# Patient Record
Sex: Male | Born: 1985 | Race: Black or African American | Hispanic: No | Marital: Single | Smoking: Current every day smoker
Health system: Southern US, Community
[De-identification: ages and names within clinical notes are randomized; demographics above are authoritative.]

## PROBLEM LIST (undated history)

## (undated) DIAGNOSIS — K259 Gastric ulcer, unspecified as acute or chronic, without hemorrhage or perforation: Secondary | ICD-10-CM

## (undated) DIAGNOSIS — K219 Gastro-esophageal reflux disease without esophagitis: Secondary | ICD-10-CM

---
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
## 2006-04-06 ENCOUNTER — Emergency Department (HOSPITAL_COMMUNITY): Admission: EM | Admit: 2006-04-06 | Discharge: 2006-04-06 | Payer: Self-pay | Admitting: Emergency Medicine

## 2013-10-16 ENCOUNTER — Emergency Department (HOSPITAL_COMMUNITY): Payer: 59

## 2013-10-16 ENCOUNTER — Encounter (HOSPITAL_COMMUNITY): Payer: Self-pay | Admitting: Emergency Medicine

## 2013-10-16 ENCOUNTER — Emergency Department (HOSPITAL_COMMUNITY)
Admission: EM | Admit: 2013-10-16 | Discharge: 2013-10-16 | Disposition: A | Discharge status: Home / Self Care | Payer: 59 | Attending: Emergency Medicine | Admitting: Emergency Medicine

## 2013-10-16 DIAGNOSIS — R109 Unspecified abdominal pain: Secondary | ICD-10-CM | POA: Insufficient documentation

## 2013-10-16 DIAGNOSIS — Z7982 Long term (current) use of aspirin: Secondary | ICD-10-CM | POA: Insufficient documentation

## 2013-10-16 DIAGNOSIS — F17200 Nicotine dependence, unspecified, uncomplicated: Secondary | ICD-10-CM | POA: Insufficient documentation

## 2013-10-16 LAB — URINALYSIS, ROUTINE W REFLEX MICROSCOPIC
BILIRUBIN URINE: NEGATIVE
Glucose, UA: NEGATIVE mg/dL
HGB URINE DIPSTICK: NEGATIVE
KETONES UR: 40 mg/dL — AB
NITRITE: NEGATIVE
Protein, ur: 30 mg/dL — AB
SPECIFIC GRAVITY, URINE: 1.025 (ref 1.005–1.030)
Urobilinogen, UA: 1 mg/dL (ref 0.0–1.0)
pH: 8 (ref 5.0–8.0)

## 2013-10-16 LAB — CBC WITH DIFFERENTIAL/PLATELET
BASOS ABS: 0 10*3/uL (ref 0.0–0.1)
BASOS PCT: 0 % (ref 0–1)
EOS ABS: 0 10*3/uL (ref 0.0–0.7)
Eosinophils Relative: 0 % (ref 0–5)
HEMATOCRIT: 42.8 % (ref 39.0–52.0)
Hemoglobin: 14.8 g/dL (ref 13.0–17.0)
LYMPHS PCT: 11 % — AB (ref 12–46)
Lymphs Abs: 1.1 10*3/uL (ref 0.7–4.0)
MCH: 31.4 pg (ref 26.0–34.0)
MCHC: 34.6 g/dL (ref 30.0–36.0)
MCV: 90.7 fL (ref 78.0–100.0)
MONOS PCT: 5 % (ref 3–12)
Monocytes Absolute: 0.5 10*3/uL (ref 0.1–1.0)
NEUTROS ABS: 8 10*3/uL — AB (ref 1.7–7.7)
NEUTROS PCT: 84 % — AB (ref 43–77)
Platelets: 175 10*3/uL (ref 150–400)
RBC: 4.72 MIL/uL (ref 4.22–5.81)
RDW: 12.4 % (ref 11.5–15.5)
WBC: 9.5 10*3/uL (ref 4.0–10.5)

## 2013-10-16 LAB — COMPREHENSIVE METABOLIC PANEL
ALBUMIN: 4.6 g/dL (ref 3.5–5.2)
ALK PHOS: 43 U/L (ref 39–117)
ALT: 16 U/L (ref 0–53)
AST: 20 U/L (ref 0–37)
Anion gap: 14 (ref 5–15)
BILIRUBIN TOTAL: 0.7 mg/dL (ref 0.3–1.2)
BUN: 10 mg/dL (ref 6–23)
CO2: 25 mEq/L (ref 19–32)
Calcium: 9.7 mg/dL (ref 8.4–10.5)
Chloride: 98 mEq/L (ref 96–112)
Creatinine, Ser: 1 mg/dL (ref 0.50–1.35)
GFR calc Af Amer: >90 mL/min (ref >90)
GLUCOSE: 94 mg/dL (ref 70–99)
POTASSIUM: 3.2 meq/L — AB (ref 3.7–5.3)
SODIUM: 137 meq/L (ref 137–147)
TOTAL PROTEIN: 7.2 g/dL (ref 6.0–8.3)

## 2013-10-16 LAB — URINE MICROSCOPIC-ADD ON

## 2013-10-16 LAB — LIPASE, BLOOD: Lipase: 18 U/L (ref 11–59)

## 2013-10-16 MED ORDER — FAMOTIDINE 20 MG PO TABS
20.0000 mg | ORAL_TABLET | Freq: Once | ORAL | Status: AC
  Administered 2013-10-16: 20 mg via ORAL
  Filled 2013-10-16: qty 1

## 2013-10-16 MED ORDER — PANTOPRAZOLE SODIUM 40 MG PO TBEC: 40.0000 mg | DELAYED_RELEASE_TABLET | Freq: Every day | ORAL | Status: DC

## 2013-10-16 MED ORDER — GI COCKTAIL ~~LOC~~
30.0000 mL | Freq: Once | ORAL | Status: AC
  Administered 2013-10-16: 30 mL via ORAL
  Filled 2013-10-16: qty 30

## 2013-10-16 MED ORDER — TRAMADOL HCL 50 MG PO TABS: 50.0000 mg | ORAL_TABLET | Freq: Four times a day (QID) | ORAL | Status: DC | PRN

## 2013-10-16 MED ORDER — POTASSIUM CHLORIDE CRYS ER 20 MEQ PO TBCR
40.0000 meq | EXTENDED_RELEASE_TABLET | Freq: Once | ORAL | Status: AC
  Administered 2013-10-16: 40 meq via ORAL
  Filled 2013-10-16: qty 2

## 2013-10-16 NOTE — Discharge Instructions (Signed)
Rest. Drink plenty of fluids. Take protonix (acid blocker medication). You may also try pepcid and maalox as need for symptom relief. You may take ultram as need for pain - no driving when taking. Follow up with primary care doctor/GI doctor, in the next 1-2 weeks if symptoms fail to improve/resolve. Return to ER right away if worse, severe abdominal pain, persistent vomiting, fevers, other concern.     Abdominal Pain Many things can cause abdominal pain. Usually, abdominal pain is not caused by a disease and will improve without treatment. It can often be observed and treated at home. Your health care provider will do a physical exam and possibly order blood tests and X-rays to help determine the seriousness of your pain. However, in many cases, more time must pass before a clear cause of the pain can be found. Before that point, your health care provider may not know if you need more testing or further treatment. HOME CARE INSTRUCTIONS  Monitor your abdominal pain for any changes. The following actions may help to alleviate any discomfort you are experiencing:  Only take over-the-counter or prescription medicines as directed by your health care provider.  Do not take laxatives unless directed to do so by your health care provider.  Try a clear liquid diet (broth, tea, or water) as directed by your health care provider. Slowly move to a bland diet as tolerated. SEEK MEDICAL CARE IF:  You have unexplained abdominal pain.  You have abdominal pain associated with nausea or diarrhea.  You have pain when you urinate or have a bowel movement.  You experience abdominal pain that wakes you in the night.  You have abdominal pain that is worsened or improved by eating food.  You have abdominal pain that is worsened with eating fatty foods.  You have a fever. SEEK IMMEDIATE MEDICAL CARE IF:   Your pain does not go away within 2 hours.  You keep throwing up (vomiting).  Your pain is felt  only in portions of the abdomen, such as the right side or the left lower portion of the abdomen.  You pass bloody or black tarry stools. MAKE SURE YOU:  Understand these instructions.   Will watch your condition.   Will get help right away if you are not doing well or get worse.  Document Released: 01/11/2005 Document Revised: 04/08/2013 Document Reviewed: 12/11/2012 [facility] Patient Information 2015 [place], [place]. This information is not intended to replace advice given to you by your health care provider. Make sure you discuss any questions you have with your health care provider.     Gastritis, Adult Gastritis is soreness and swelling (inflammation) of the lining of the stomach. Gastritis can develop as a sudden onset (acute) or long-term (chronic) condition. If gastritis is not treated, it can lead to stomach bleeding and ulcers. CAUSES  Gastritis occurs when the stomach lining is weak or damaged. Digestive juices from the stomach then inflame the weakened stomach lining. The stomach lining may be weak or damaged due to viral or bacterial infections. One common bacterial infection is the Helicobacter pylori infection. Gastritis can also result from excessive alcohol consumption, taking certain medicines, or having too much acid in the stomach.  SYMPTOMS  In some cases, there are no symptoms. When symptoms are present, they may include:  Pain or a burning sensation in the upper abdomen.  Nausea.  Vomiting.  An uncomfortable feeling of fullness after eating. DIAGNOSIS  Your caregiver may suspect you have gastritis based on your  symptoms and a physical exam. To determine the cause of your gastritis, your caregiver may perform the following:  Blood or stool tests to check for the H pylori bacterium.  Gastroscopy. A thin, flexible tube (endoscope) is passed down the esophagus and into the stomach. The endoscope has a light and camera on the end. Your caregiver uses the  endoscope to view the inside of the stomach.  Taking a tissue sample (biopsy) from the stomach to examine under a microscope. TREATMENT  Depending on the cause of your gastritis, medicines may be prescribed. If you have a bacterial infection, such as an H pylori infection, antibiotics may be given. If your gastritis is caused by too much acid in the stomach, H2 blockers or antacids may be given. Your caregiver may recommend that you stop taking aspirin, ibuprofen, or other nonsteroidal anti-inflammatory drugs (NSAIDs). HOME CARE INSTRUCTIONS  Only take over-the-counter or prescription medicines as directed by your caregiver.  If you were given antibiotic medicines, take them as directed. Finish them even if you start to feel better.  Drink enough fluids to keep your urine clear or pale yellow.  Avoid foods and drinks that make your symptoms worse, such as:  Caffeine or alcoholic drinks.  Chocolate.  Peppermint or mint flavorings.  Garlic and onions.  Spicy foods.  Citrus fruits, such as oranges, lemons, or limes.  Tomato-based foods such as sauce, chili, salsa, and pizza.  Fried and fatty foods.  Eat small, frequent meals instead of large meals. SEEK IMMEDIATE MEDICAL CARE IF:   You have black or dark red stools.  You vomit blood or material that looks like coffee grounds.  You are unable to keep fluids down.  Your abdominal pain gets worse.  You have a fever.  You do not feel better after 1 week.  You have any other questions or concerns. MAKE SURE YOU:  Understand these instructions.  Will watch your condition.  Will get help right away if you are not doing well or get worse. Document Released: 03/28/2001 Document Revised: 10/03/2011 Document Reviewed: 05/17/2011 [facility]Exit[facility] Patient Information 2015 [place]ExitCare, [place]LL[place]. This information is not intended to replace advice given to you by your health care provider. Make sure you discuss any questions you have with your  health care provider.

## 2013-10-16 NOTE — ED Provider Notes (Addendum)
CSN: [id number]     Arrival date & time 10/16/13  [id number] History   First MD Initiated Contact with Patient 10/16/13 1007     Chief Complaint  Patient presents with  . Abdominal Pain     (Consider location/radiation/quality/duration/timing/severity/associated sxs/prior Treatment) Patient is a 28 y.o. male presenting with abdominal pain. The history is provided by the patient and a parent.  Abdominal Pain Associated symptoms: no chest pain, no cough, no diarrhea, no fever, no shortness of breath, no sore throat and no vomiting   pt c/o mid to upper abdominal pain in the past several months. No acute or abrupt change today. States at times is worse especially if/when he tries to eat a lot in the morning. Pain constant, dull, non radiating. No back/flank pain. No nv. No fever or chills. Having normal bms. No dysuria, hematuria or gu c/o. No recent wt loss or wt gain. No hx pud, pancreatitis, or gallstones. No prior abd surgery.  Parent adds she thinks it may be related to stress.    History reviewed. No pertinent past medical history. History reviewed. No pertinent past surgical history. No family history on file. History  Substance Use Topics  . Smoking status: Current Every Day Smoker    Types: Cigarettes  . Smokeless tobacco: Not on file  . Alcohol Use: No    Review of Systems  Constitutional: Negative for fever.  HENT: Negative for sore throat.   Eyes: Negative for redness.  Respiratory: Negative for cough and shortness of breath.   Cardiovascular: Negative for chest pain.  Gastrointestinal: Positive for abdominal pain. Negative for vomiting, diarrhea and blood in stool.  Genitourinary: Negative for flank pain.  Musculoskeletal: Negative for back pain and neck pain.  Skin: Negative for rash.  Neurological: Negative for headaches.  Hematological: Does not bruise/bleed easily.  Psychiatric/Behavioral: Negative for confusion.      Allergies  Review of patient's allergies  indicates no known allergies.  Home Medications   Prior to Admission medications   Medication Sig Start Date End Date Taking? Authorizing Provider  aspirin 325 MG tablet Take 325 mg by mouth daily.   Yes Historical Provider, MD   BP 108/64  Pulse 63  Temp(Src) 98 F (36.7 C) (Oral)  Resp 17  SpO2 99% Physical Exam  Nursing note and vitals reviewed. Constitutional: He is oriented to person, place, and time. He appears well-developed and well-nourished. No distress.  HENT:  Mouth/Throat: Oropharynx is clear and moist.  Eyes: Conjunctivae are normal. No scleral icterus.  Neck: Neck supple. No tracheal deviation present.  Cardiovascular: Normal rate, regular rhythm, normal heart sounds and intact distal pulses.   Pulmonary/Chest: Effort normal and breath sounds normal. No accessory muscle usage. No respiratory distress.  Abdominal: Soft. Bowel sounds are normal. He exhibits no distension and no mass. There is no tenderness. There is no rebound and no guarding.  No hernia  Genitourinary:  Normal ext genitalia. No cva tenderness.   Musculoskeletal: Normal range of motion.  Neurological: He is alert and oriented to person, place, and time.  Skin: Skin is warm and dry.  Psychiatric: He has a normal mood and affect.    ED Course  Procedures (including critical care time) Labs Review  Results for orders placed during the hospital encounter of 10/16/13  CBC WITH DIFFERENTIAL      Result Value Ref Range   WBC 9.5  4.0 - 10.5 K/uL   RBC 4.72  4.22 - 5.81 MIL/uL   Hemoglobin 14.8  13.0 - 17.0 g/dL   HCT 42.8  29.539.0 - 28.452.0 %   MCV 90.7  69.678.0 - 100.0 fL   MCH 31.4  26.0 - 34.0 pg   MCHC 34.6  30.0 - 36.0 g/dL   RDW 13.212.4  44.011.5 - 10.215.5 %   Platelets 175  150 - 400 K/uL   Neutrophils Relative % 84 (*) 43 - 77 %   Neutro Abs 8.0 (*) 1.7 - 7.7 K/uL   Lymphocytes Relative 11 (*) 12 - 46 %   Lymphs Abs 1.1  0.7 - 4.0 K/uL   Monocytes Relative 5  3 - 12 %   Monocytes Absolute 0.5  0.1 -  1.0 K/uL   Eosinophils Relative 0  0 - 5 %   Eosinophils Absolute 0.0  0.0 - 0.7 K/uL   Basophils Relative 0  0 - 1 %   Basophils Absolute 0.0  0.0 - 0.1 K/uL  COMPREHENSIVE METABOLIC PANEL      Result Value Ref Range   Sodium 137  137 - 147 mEq/L   Potassium 3.2 (*) 3.7 - 5.3 mEq/L   Chloride 98  96 - 112 mEq/L   CO2 25  19 - 32 mEq/L   Glucose, Bld 94  70 - 99 mg/dL   BUN 10  6 - 23 mg/dL   Creatinine, Ser 7.251.00  0.50 - 1.35 mg/dL   Calcium 9.7  8.4 - 36.610.5 mg/dL   Total Protein 7.2  6.0 - 8.3 g/dL   Albumin 4.6  3.5 - 5.2 g/dL   AST 20  0 - 37 U/L   ALT 16  0 - 53 U/L   Alkaline Phosphatase 43  39 - 117 U/L   Total Bilirubin 0.7  0.3 - 1.2 mg/dL   GFR calc non Af Amer >90  >90 mL/min   GFR calc Af Amer >90  >90 mL/min   Anion gap 14  5 - 15  LIPASE, BLOOD      Result Value Ref Range   Lipase 18  11 - 59 U/L  URINALYSIS, ROUTINE W REFLEX MICROSCOPIC      Result Value Ref Range   Color, Urine AMBER (*) YELLOW   APPearance CLEAR  CLEAR   Specific Gravity, Urine 1.025  1.005 - 1.030   pH 8.0  5.0 - 8.0   Glucose, UA NEGATIVE  NEGATIVE mg/dL   Hgb urine dipstick NEGATIVE  NEGATIVE   Bilirubin Urine NEGATIVE  NEGATIVE   Ketones, ur 40 (*) NEGATIVE mg/dL   Protein, ur 30 (*) NEGATIVE mg/dL   Urobilinogen, UA 1.0  0.0 - 1.0 mg/dL   Nitrite NEGATIVE  NEGATIVE   Leukocytes, UA SMALL (*) NEGATIVE  URINE MICROSCOPIC-ADD ON      Result Value Ref Range   WBC, UA 3-6  <3 WBC/hpf   Urine-Other MUCOUS PRESENT     Dg Abd 1 View  10/16/2013   CLINICAL DATA:  Lower abdominal pain, vomiting  EXAM: ABDOMEN - 1 VIEW  COMPARISON:  None.  FINDINGS: There is nonobstructive bowel gas pattern some colonic gas noted without colonic distension.  IMPRESSION: Nonobstructive bowel gas pattern. Some colonic gas noted without significant colonic distention.   Electronically Signed   By: Natasha MeadLiviu  Pop M.D.   On: 10/16/2013 11:03     MDM  Labs.  Reviewed nursing notes and prior charts for additional  history.   pepcid and gi cocktail po.  Relief w gi meds.  k low, kcl po.  Discussed  results w pt.  On recheck abd soft nt.  No nv. Afeb.  Discussed diff dx including possible gastritis.   Given prolonged nature pain, will also provide referral to gi, in addition to acid blocker rx.  Pt appears stable for d/c.  At d/c, pt requests pain med for home as well.     Suzi Roots, MD 10/16/13 [phone]

## 2013-10-16 NOTE — ED Notes (Signed)
Pt transported to xray 

## 2013-10-16 NOTE — ED Notes (Signed)
Pt c/o abd pain that has been going on for past several months. Pt states that the pain this morning got so bad that's what brought him in to be seen.  Pt state he has occasional n/v/d.

## 2013-10-16 NOTE — ED Notes (Addendum)
Pt reports intermittent abdominal pain for months but worsening this am. Pt reports one episode of vomiting yesterday. Pt denies nausea at present time. Pt reports small loose stool today.

## 2013-10-16 NOTE — ED Notes (Addendum)
Pt in restroom providing urine sample at present time will assess and administer pain medication with pt return.

## 2013-10-20 ENCOUNTER — Other Ambulatory Visit: Payer: Self-pay | Admitting: Gastroenterology

## 2013-10-20 DIAGNOSIS — R109 Unspecified abdominal pain: Secondary | ICD-10-CM

## 2013-10-22 ENCOUNTER — Encounter (HOSPITAL_COMMUNITY): Payer: Self-pay | Admitting: Emergency Medicine

## 2013-10-22 ENCOUNTER — Emergency Department (HOSPITAL_COMMUNITY)
Admission: EM | Admit: 2013-10-22 | Discharge: 2013-10-22 | Disposition: A | Discharge status: Home / Self Care | Payer: 59 | Attending: Emergency Medicine | Admitting: Emergency Medicine

## 2013-10-22 ENCOUNTER — Emergency Department (HOSPITAL_COMMUNITY): Payer: 59

## 2013-10-22 DIAGNOSIS — Z79899 Other long term (current) drug therapy: Secondary | ICD-10-CM | POA: Insufficient documentation

## 2013-10-22 DIAGNOSIS — Z792 Long term (current) use of antibiotics: Secondary | ICD-10-CM | POA: Insufficient documentation

## 2013-10-22 DIAGNOSIS — R1110 Vomiting, unspecified: Secondary | ICD-10-CM | POA: Insufficient documentation

## 2013-10-22 DIAGNOSIS — R1013 Epigastric pain: Secondary | ICD-10-CM | POA: Insufficient documentation

## 2013-10-22 DIAGNOSIS — F17200 Nicotine dependence, unspecified, uncomplicated: Secondary | ICD-10-CM | POA: Insufficient documentation

## 2013-10-22 DIAGNOSIS — R109 Unspecified abdominal pain: Secondary | ICD-10-CM

## 2013-10-22 LAB — COMPREHENSIVE METABOLIC PANEL
ALBUMIN: 4.3 g/dL (ref 3.5–5.2)
ALT: 16 U/L (ref 0–53)
ANION GAP: 16 — AB (ref 5–15)
AST: 23 U/L (ref 0–37)
Alkaline Phosphatase: 33 U/L — ABNORMAL LOW (ref 39–117)
BILIRUBIN TOTAL: 0.6 mg/dL (ref 0.3–1.2)
BUN: 7 mg/dL (ref 6–23)
CALCIUM: 9.1 mg/dL (ref 8.4–10.5)
CO2: 23 mEq/L (ref 19–32)
CREATININE: 1.17 mg/dL (ref 0.50–1.35)
Chloride: 99 mEq/L (ref 96–112)
GFR calc Af Amer: >90 mL/min (ref >90)
GFR calc non Af Amer: 84 mL/min — ABNORMAL LOW (ref >90)
GLUCOSE: 101 mg/dL — AB (ref 70–99)
Potassium: 3.8 mEq/L (ref 3.7–5.3)
SODIUM: 138 meq/L (ref 137–147)
Total Protein: 6.9 g/dL (ref 6.0–8.3)

## 2013-10-22 LAB — URINALYSIS, ROUTINE W REFLEX MICROSCOPIC
Bilirubin Urine: NEGATIVE
GLUCOSE, UA: NEGATIVE mg/dL
Hgb urine dipstick: NEGATIVE
KETONES UR: 15 mg/dL — AB
Leukocytes, UA: NEGATIVE
NITRITE: NEGATIVE
PH: 8.5 — AB (ref 5.0–8.0)
PROTEIN: NEGATIVE mg/dL
Specific Gravity, Urine: 1.024 (ref 1.005–1.030)
UROBILINOGEN UA: 1 mg/dL (ref 0.0–1.0)

## 2013-10-22 LAB — CBC
HCT: 42.4 % (ref 39.0–52.0)
Hemoglobin: 14.5 g/dL (ref 13.0–17.0)
MCH: 31.2 pg (ref 26.0–34.0)
MCHC: 34.2 g/dL (ref 30.0–36.0)
MCV: 91.2 fL (ref 78.0–100.0)
Platelets: 196 10*3/uL (ref 150–400)
RBC: 4.65 MIL/uL (ref 4.22–5.81)
RDW: 12.6 % (ref 11.5–15.5)
WBC: 5.2 10*3/uL (ref 4.0–10.5)

## 2013-10-22 LAB — LIPASE, BLOOD: Lipase: 14 U/L (ref 11–59)

## 2013-10-22 MED ORDER — IOHEXOL 300 MG/ML  SOLN
25.0000 mL | INTRAMUSCULAR | Status: AC
  Administered 2013-10-22: 25 mL via ORAL

## 2013-10-22 MED ORDER — ONDANSETRON 8 MG PO TBDP: 8.0000 mg | ORAL_TABLET | Freq: Three times a day (TID) | ORAL | Status: DC | PRN

## 2013-10-22 MED ORDER — PANTOPRAZOLE SODIUM 40 MG IV SOLR
40.0000 mg | Freq: Once | INTRAVENOUS | Status: AC
  Administered 2013-10-22: 40 mg via INTRAVENOUS
  Filled 2013-10-22: qty 40

## 2013-10-22 MED ORDER — ONDANSETRON HCL 4 MG/2ML IJ SOLN
4.0000 mg | Freq: Once | INTRAMUSCULAR | Status: AC
  Administered 2013-10-22: 4 mg via INTRAVENOUS
  Filled 2013-10-22: qty 2

## 2013-10-22 MED ORDER — SODIUM CHLORIDE 0.9 % IV BOLUS (SEPSIS)
1000.0000 mL | Freq: Once | INTRAVENOUS | Status: AC
  Administered 2013-10-22: 1000 mL via INTRAVENOUS

## 2013-10-22 MED ORDER — TRAMADOL HCL 50 MG PO TABS: 50.0000 mg | ORAL_TABLET | Freq: Four times a day (QID) | ORAL | Status: DC | PRN

## 2013-10-22 MED ORDER — HYDROMORPHONE HCL PF 1 MG/ML IJ SOLN
0.5000 mg | Freq: Once | INTRAMUSCULAR | Status: AC
  Administered 2013-10-22: 0.5 mg via INTRAVENOUS
  Filled 2013-10-22: qty 1

## 2013-10-22 MED ORDER — IOHEXOL 300 MG/ML  SOLN
80.0000 mL | Freq: Once | INTRAMUSCULAR | Status: AC | PRN
  Administered 2013-10-22: 80 mL via INTRAVENOUS

## 2013-10-22 NOTE — ED Notes (Signed)
Pt resting quietly at the time. Vital signs stable. No signs of distress noted. Denies pain. Mother at bedside.

## 2013-10-22 NOTE — ED Notes (Signed)
[facility] and vomiting, was diagnosed with ulcer and sent to GI dr. Rock NephewPt went to GI appt on Monday and was diagnosed with UTI and started on antibiotics. Pt still having n/v and generalized abd pain.

## 2013-10-22 NOTE — ED Notes (Signed)
Pt denies abdominal pain at the time. Finished drinking oral contrast. Family at bedside. No signs of distress noted.

## 2013-10-22 NOTE — Discharge Instructions (Signed)
Try pepcid, maalox, or gas x as need for symptom relief. You may take zofran as need for nausea. You may take ultram as need for pain - no driving when taking.  Follow up with your gi doctor in the next 1-2 weeks. Return to ER if worse, new symptoms, fevers, persistent vomiting, other concern.  You were given pain medication in the ER - no driving for the next 4 hours.       Abdominal Pain Many things can cause abdominal pain. Usually, abdominal pain is not caused by a disease and will improve without treatment. It can often be observed and treated at home. Your health care provider will do a physical exam and possibly order blood tests and X-rays to help determine the seriousness of your pain. However, in many cases, more time must pass before a clear cause of the pain can be found. Before that point, your health care provider may not know if you need more testing or further treatment. HOME CARE INSTRUCTIONS  Monitor your abdominal pain for any changes. The following actions may help to alleviate any discomfort you are experiencing:  Only take over-the-counter or prescription medicines as directed by your health care provider.  Do not take laxatives unless directed to do so by your health care provider.  Try a clear liquid diet (broth, tea, or water) as directed by your health care provider. Slowly move to a bland diet as tolerated. SEEK MEDICAL CARE IF:  You have unexplained abdominal pain.  You have abdominal pain associated with nausea or diarrhea.  You have pain when you urinate or have a bowel movement.  You experience abdominal pain that wakes you in the night.  You have abdominal pain that is worsened or improved by eating food.  You have abdominal pain that is worsened with eating fatty foods.  You have a fever. SEEK IMMEDIATE MEDICAL CARE IF:   Your pain does not go away within 2 hours.  You keep throwing up (vomiting).  Your pain is felt only in portions of the  abdomen, such as the right side or the left lower portion of the abdomen.  You pass bloody or black tarry stools. MAKE SURE YOU:  Understand these instructions.   Will watch your condition.   Will get help right away if you are not doing well or get worse.  Document Released: 01/11/2005 Document Revised: 04/08/2013 Document Reviewed: 12/11/2012 [facility] Patient Information 2015 [place]Care, [place]. This information is not intended to replace advice given to you by your health care provider. Make sure you discuss any questions you have with your health care provider.

## 2013-10-22 NOTE — ED Notes (Signed)
Pt brought back to room via wheelchair with family in tow; pt getting undressed and into a gown at this time; Chastity, NT present in room

## 2013-10-22 NOTE — ED Provider Notes (Signed)
CSN: [id number]     Arrival date & time 10/22/13  1131 History   First MD Initiated Contact with Patient 10/22/13 1206     Chief Complaint  Patient presents with  . Abdominal Pain  . Emesis     (Consider location/radiation/quality/duration/timing/severity/associated sxs/prior Treatment) Patient is a 28 y.o. male presenting with abdominal pain and vomiting. The history is provided by the patient.  Abdominal Pain Associated symptoms: vomiting   Associated symptoms: no chest pain, no chills, no cough, no dysuria, no fever, no hematuria, no shortness of breath and no sore throat   Emesis Associated symptoms: abdominal pain   Associated symptoms: no chills, no headaches and no sore throat   pt c/o mid to upper abdominal pain in the past several months. No acute or abrupt change today. States at times is worse especially if/when he tries to eat a lot in the morning. Pain constant, dull, non radiating. No back/flank pain. No nv. No fever or chills. Having normal bms. No dysuria, hematuria or gu c/o. No recent wt loss or wt gain. No hx pud, pancreatitis, or gallstones. No prior abd surgery. Parent adds she thinks it may be related to stress.  Was recently in ed for same and referred to Gi.  States saw gi this week, who told pt possible uti, and placed on bactrim.  When they called back the gi doctor today, they were told to go to ER for CT scan.     History reviewed. No pertinent past medical history. History reviewed. No pertinent past surgical history. History reviewed. No pertinent family history. History  Substance Use Topics  . Smoking status: Current Every Day Smoker    Types: Cigarettes  . Smokeless tobacco: Not on file  . Alcohol Use: No    Review of Systems  Constitutional: Negative for fever and chills.  HENT: Negative for sore throat.   Eyes: Negative for redness.  Respiratory: Negative for cough and shortness of breath.   Cardiovascular: Negative for chest pain, palpitations  and leg swelling.  Gastrointestinal: Positive for vomiting and abdominal pain. Negative for blood in stool.  Genitourinary: Negative for dysuria, hematuria and flank pain.  Musculoskeletal: Negative for back pain and neck pain.  Skin: Negative for rash.  Neurological: Negative for weakness, numbness and headaches.  Hematological: Does not bruise/bleed easily.  Psychiatric/Behavioral: Negative for confusion.      Allergies  Review of patient's allergies indicates no known allergies.  Home Medications   Prior to Admission medications   Medication Sig Start Date End Date Taking? Authorizing Provider  acetaminophen (TYLENOL) 325 MG tablet Take 325 mg by mouth every 6 (six) hours as needed for mild pain.   Yes Historical Provider, MD  diphenhydrAMINE (BENADRYL) 25 mg capsule Take 25 mg by mouth at bedtime as needed for sleep.   Yes Historical Provider, MD  pantoprazole (PROTONIX) 40 MG tablet Take 1 tablet (40 mg total) by mouth daily. 10/16/13  Yes Suzi Roots, MD  sulfamethoxazole-trimethoprim (BACTRIM DS) 800-160 MG per tablet Take 1 tablet by mouth 2 (two) times daily. 10/20/13 10/26/13 Yes Historical Provider, MD  traMADol (ULTRAM) 50 MG tablet Take 1 tablet (50 mg total) by mouth every 6 (six) hours as needed. 10/16/13  Yes Suzi Roots, MD   BP 122/75  Pulse 62  Temp(Src) 98.1 F (36.7 C) (Oral)  Resp 18  Wt 117 lb (53.071 kg)  SpO2 100% Physical Exam  Nursing note and vitals reviewed. Constitutional: He is oriented to person,  place, and time. He appears well-developed and well-nourished. No distress.  HENT:  Mouth/Throat: Oropharynx is clear and moist.  Eyes: Conjunctivae are normal. No scleral icterus.  Neck: Neck supple. No tracheal deviation present. No thyromegaly present.  Cardiovascular: Normal rate, regular rhythm, normal heart sounds and intact distal pulses.  Exam reveals no gallop and no friction rub.   No murmur heard. Pulmonary/Chest: Effort normal and breath  sounds normal. No accessory muscle usage. No respiratory distress.  Abdominal: Soft. Bowel sounds are normal. He exhibits no distension and no mass. There is no rebound and no guarding.  Epigastric and mid to upper abd tenderness. No hernia.   Genitourinary:  No cva tenderness  Musculoskeletal: Normal range of motion. He exhibits no edema and no tenderness.  Neurological: He is alert and oriented to person, place, and time.  Skin: Skin is warm and dry. No rash noted.  Psychiatric: He has a normal mood and affect.    ED Course  Procedures (including critical care time) Labs Review  Results for orders placed during the hospital encounter of 10/22/13  CBC      Result Value Ref Range   WBC 5.2  4.0 - 10.5 K/uL   RBC 4.65  4.22 - 5.81 MIL/uL   Hemoglobin 14.5  13.0 - 17.0 g/dL   HCT 30.842.4  65.739.0 - 84.652.0 %   MCV 91.2  78.0 - 100.0 fL   MCH 31.2  26.0 - 34.0 pg   MCHC 34.2  30.0 - 36.0 g/dL   RDW 96.212.6  95.211.5 - 84.115.5 %   Platelets 196  150 - 400 K/uL  COMPREHENSIVE METABOLIC PANEL      Result Value Ref Range   Sodium 138  137 - 147 mEq/L   Potassium 3.8  3.7 - 5.3 mEq/L   Chloride 99  96 - 112 mEq/L   CO2 23  19 - 32 mEq/L   Glucose, Bld 101 (*) 70 - 99 mg/dL   BUN 7  6 - 23 mg/dL   Creatinine, Ser 3.241.17  0.50 - 1.35 mg/dL   Calcium 9.1  8.4 - 40.110.5 mg/dL   Total Protein 6.9  6.0 - 8.3 g/dL   Albumin 4.3  3.5 - 5.2 g/dL   AST 23  0 - 37 U/L   ALT 16  0 - 53 U/L   Alkaline Phosphatase 33 (*) 39 - 117 U/L   Total Bilirubin 0.6  0.3 - 1.2 mg/dL   GFR calc non Af Amer 84 (*) >90 mL/min   GFR calc Af Amer >90  >90 mL/min   Anion gap 16 (*) 5 - 15  LIPASE, BLOOD      Result Value Ref Range   Lipase 14  11 - 59 U/L  URINALYSIS, ROUTINE W REFLEX MICROSCOPIC      Result Value Ref Range   Color, Urine YELLOW  YELLOW   APPearance CLEAR  CLEAR   Specific Gravity, Urine 1.024  1.005 - 1.030   pH 8.5 (*) 5.0 - 8.0   Glucose, UA NEGATIVE  NEGATIVE mg/dL   Hgb urine dipstick NEGATIVE   NEGATIVE   Bilirubin Urine NEGATIVE  NEGATIVE   Ketones, ur 15 (*) NEGATIVE mg/dL   Protein, ur NEGATIVE  NEGATIVE mg/dL   Urobilinogen, UA 1.0  0.0 - 1.0 mg/dL   Nitrite NEGATIVE  NEGATIVE   Leukocytes, UA NEGATIVE  NEGATIVE   Dg Abd 1 View  10/16/2013   CLINICAL DATA:  Lower abdominal pain, vomiting  EXAM: ABDOMEN - 1 VIEW  COMPARISON:  None.  FINDINGS: There is nonobstructive bowel gas pattern some colonic gas noted without colonic distension.  IMPRESSION: Nonobstructive bowel gas pattern. Some colonic gas noted without significant colonic distention.   Electronically Signed   By: Natasha MeadLiviu  Pop M.D.   On: 10/16/2013 11:03   Ct Abdomen Pelvis W Contrast  10/22/2013   CLINICAL DATA:  Persistent abdominal pain and vomiting  EXAM: CT ABDOMEN AND PELVIS WITH CONTRAST  TECHNIQUE: Multidetector CT imaging of the abdomen and pelvis was performed using the standard protocol following bolus administration of intravenous contrast. Oral contrast was also administered.  CONTRAST:  80mL OMNIPAQUE IOHEXOL 300 MG/ML  SOLN  COMPARISON:  None.  FINDINGS: Lung bases are clear.  Liver is prominent, measuring 16.0 cm in length. There is fatty change in the liver. No focal liver lesions are identified. Gallbladder wall is not thickened. There is no appreciable biliary duct dilatation.  Spleen, pancreas, and adrenals appear normal. Kidneys bilaterally show no mass or calculus on either side. There is no renal or ureteral calculus on either side.  In the pelvis, the urinary bladder is midline with normal wall thickness. There is a questionable hydrocele in the right scrotal sac. There is no pelvic mass or fluid collection. The appendix appears within normal limits.  There is no appreciable bowel obstruction. There is no free air or portal venous air.  There is no ascites, adenopathy, or abscess in the abdomen or pelvis. There is no evidence of abdominal aortic aneurysm. There are no appreciable blastic or lytic bone lesions.   IMPRESSION: No bowel obstruction. No abscess. No mesenteric inflammation. Appendix appears normal. No renal or ureteral calculus. No hydronephrosis.  Question right scrotal hydrocele. Note that the scrotum is incompletely visualized on this study.  Liver prominent with fatty change.   Electronically Signed   By: Bretta BangWilliam  Woodruff M.D.   On: 10/22/2013 15:40      MDM  Iv ns bolus. Zofran. Labs.  Pt/family states their gi doctor wanted ct scan done.  Reviewed nursing notes and prior charts for additional history.   Recheck pt comfortable. No nv.  Ct neg acute.   Pt appears stable for d/c.Marland Kitchen. Has plan for u/s and gi follow up arranged.      Suzi RootsKevin E Marjarie Irion, MD 10/22/13 [id number]

## 2013-10-24 ENCOUNTER — Ambulatory Visit (HOSPITAL_COMMUNITY)
Admission: RE | Admit: 2013-10-24 | Discharge: 2013-10-24 | Disposition: A | Discharge status: Home / Self Care | Payer: 59 | Source: Ambulatory Visit | Attending: Gastroenterology | Admitting: Gastroenterology

## 2013-10-24 DIAGNOSIS — R109 Unspecified abdominal pain: Secondary | ICD-10-CM | POA: Insufficient documentation

## 2013-10-27 ENCOUNTER — Other Ambulatory Visit: Payer: 59

## 2015-03-07 ENCOUNTER — Emergency Department (HOSPITAL_COMMUNITY): Payer: 59

## 2015-03-07 ENCOUNTER — Encounter (HOSPITAL_COMMUNITY): Payer: Self-pay | Admitting: Emergency Medicine

## 2015-03-07 ENCOUNTER — Emergency Department (HOSPITAL_COMMUNITY)
Admission: EM | Admit: 2015-03-07 | Discharge: 2015-03-07 | Disposition: A | Discharge status: Home / Self Care | Payer: 59 | Attending: Emergency Medicine | Admitting: Emergency Medicine

## 2015-03-07 DIAGNOSIS — R109 Unspecified abdominal pain: Secondary | ICD-10-CM | POA: Diagnosis present

## 2015-03-07 DIAGNOSIS — N1330 Unspecified hydronephrosis: Secondary | ICD-10-CM | POA: Diagnosis not present

## 2015-03-07 DIAGNOSIS — F17210 Nicotine dependence, cigarettes, uncomplicated: Secondary | ICD-10-CM | POA: Insufficient documentation

## 2015-03-07 DIAGNOSIS — Z8719 Personal history of other diseases of the digestive system: Secondary | ICD-10-CM | POA: Insufficient documentation

## 2015-03-07 DIAGNOSIS — N200 Calculus of kidney: Secondary | ICD-10-CM | POA: Insufficient documentation

## 2015-03-07 DIAGNOSIS — R1031 Right lower quadrant pain: Secondary | ICD-10-CM | POA: Diagnosis present

## 2015-03-07 HISTORY — DX: Gastric ulcer, unspecified as acute or chronic, without hemorrhage or perforation: K25.9

## 2015-03-07 HISTORY — DX: Gastro-esophageal reflux disease without esophagitis: K21.9

## 2015-03-07 LAB — COMPREHENSIVE METABOLIC PANEL
ALBUMIN: 4.4 g/dL (ref 3.5–5.0)
ALK PHOS: 40 U/L (ref 38–126)
ALT: 20 U/L (ref 17–63)
ANION GAP: 10 (ref 5–15)
AST: 30 U/L (ref 15–41)
BUN: 10 mg/dL (ref 6–20)
CALCIUM: 8.9 mg/dL (ref 8.9–10.3)
CO2: 26 mmol/L (ref 22–32)
Chloride: 103 mmol/L (ref 101–111)
Creatinine, Ser: 1.12 mg/dL (ref 0.61–1.24)
GFR calc Af Amer: >60 mL/min (ref >60)
GFR calc non Af Amer: >60 mL/min (ref >60)
GLUCOSE: 119 mg/dL — AB (ref 65–99)
POTASSIUM: 4.2 mmol/L (ref 3.5–5.1)
SODIUM: 139 mmol/L (ref 135–145)
Total Bilirubin: 0.6 mg/dL (ref 0.3–1.2)
Total Protein: 7.1 g/dL (ref 6.5–8.1)

## 2015-03-07 LAB — URINALYSIS, ROUTINE W REFLEX MICROSCOPIC
BILIRUBIN URINE: NEGATIVE
GLUCOSE, UA: NEGATIVE mg/dL
Ketones, ur: NEGATIVE mg/dL
Nitrite: NEGATIVE
PH: 6 (ref 5.0–8.0)
Protein, ur: 30 mg/dL — AB
SPECIFIC GRAVITY, URINE: 1.019 (ref 1.005–1.030)

## 2015-03-07 LAB — URINE MICROSCOPIC-ADD ON

## 2015-03-07 LAB — CBC
HEMATOCRIT: 46 % (ref 39.0–52.0)
HEMOGLOBIN: 15.5 g/dL (ref 13.0–17.0)
MCH: 32 pg (ref 26.0–34.0)
MCHC: 33.7 g/dL (ref 30.0–36.0)
MCV: 94.8 fL (ref 78.0–100.0)
Platelets: 168 10*3/uL (ref 150–400)
RBC: 4.85 MIL/uL (ref 4.22–5.81)
RDW: 12.7 % (ref 11.5–15.5)
WBC: 5.9 10*3/uL (ref 4.0–10.5)

## 2015-03-07 LAB — LIPASE, BLOOD: Lipase: 27 U/L (ref 11–51)

## 2015-03-07 MED ORDER — KETOROLAC TROMETHAMINE 10 MG PO TABS: 10.0000 mg | ORAL_TABLET | Freq: Four times a day (QID) | ORAL | Status: DC | PRN

## 2015-03-07 MED ORDER — TAMSULOSIN HCL 0.4 MG PO CAPS: 0.4000 mg | ORAL_CAPSULE | Freq: Every day | ORAL | Status: DC

## 2015-03-07 MED ORDER — SODIUM CHLORIDE 0.9 % IV BOLUS (SEPSIS)
1000.0000 mL | Freq: Once | INTRAVENOUS | Status: AC
  Administered 2015-03-07: 1000 mL via INTRAVENOUS

## 2015-03-07 MED ORDER — KETOROLAC TROMETHAMINE 30 MG/ML IJ SOLN
30.0000 mg | Freq: Once | INTRAMUSCULAR | Status: AC
  Administered 2015-03-07: 30 mg via INTRAVENOUS
  Filled 2015-03-07: qty 1

## 2015-03-07 MED ORDER — ONDANSETRON HCL 4 MG/2ML IJ SOLN
4.0000 mg | Freq: Once | INTRAMUSCULAR | Status: AC
  Administered 2015-03-07: 4 mg via INTRAVENOUS
  Filled 2015-03-07: qty 2

## 2015-03-07 NOTE — ED Notes (Signed)
Pt woke up this am with RLQ pain and R flank pain accompanied by emesis. No diarrhea.

## 2015-03-07 NOTE — ED Provider Notes (Signed)
CSN: [id number]     Arrival date & time 03/07/15  0714 History   First MD Initiated Contact with Patient 03/07/15 [phone]     Chief Complaint  Patient presents with  . Abdominal Pain     (Consider location/radiation/quality/duration/timing/severity/associated sxs/prior Treatment) HPI.... Right flank pain radiating to the right lower quadrant since early this morning. No previous history of kidney stone. No dysuria, fever, chills, hematuria. Severity of pain is moderate. He is normally quite healthy.  Past Medical History  Diagnosis Date  . GERD (gastroesophageal reflux disease)   . Stomach ulcer    History reviewed. No pertinent past surgical history. History reviewed. No pertinent family history. Social History  Substance Use Topics  . Smoking status: Current Every Day Smoker    Types: Cigarettes  . Smokeless tobacco: None  . Alcohol Use: No    Review of Systems  All other systems reviewed and are negative.     Allergies  Review of patient's allergies indicates no known allergies.  Home Medications   Prior to Admission medications   Medication Sig Start Date End Date Taking? Authorizing Provider  acetaminophen (TYLENOL) 325 MG tablet Take 325 mg by mouth every 6 (six) hours as needed for mild pain.   Yes Historical Provider, MD  diphenhydrAMINE (BENADRYL) 25 mg capsule Take 25 mg by mouth at bedtime as needed for sleep.   Yes Historical Provider, MD  ketorolac (TORADOL) 10 MG tablet Take 1 tablet (10 mg total) by mouth every 6 (six) hours as needed. 03/07/15   Donnetta HutchingBrian Lexani Corona, MD  tamsulosin (FLOMAX) 0.4 MG CAPS capsule Take 1 capsule (0.4 mg total) by mouth daily. 03/07/15   Donnetta HutchingBrian Mayukha Symmonds, MD   BP 106/66 mmHg  Pulse 59  Temp(Src) 97.9 F (36.6 C) (Oral)  Resp 16  SpO2 99% Physical Exam  Constitutional: He is oriented to person, place, and time. He appears well-developed and well-nourished.  HENT:  Head: Normocephalic and atraumatic.  Eyes: Conjunctivae and EOM are  normal. Pupils are equal, round, and reactive to light.  Neck: Normal range of motion. Neck supple.  Cardiovascular: Normal rate and regular rhythm.   Pulmonary/Chest: Effort normal and breath sounds normal.  Abdominal: Soft. Bowel sounds are normal.  Minimal tenderness right lower quadrant  Genitourinary:  Tender right flank  Musculoskeletal: Normal range of motion.  Neurological: He is alert and oriented to person, place, and time.  Skin: Skin is warm and dry.  Psychiatric: He has a normal mood and affect. His behavior is normal.  Nursing note and vitals reviewed.   ED Course  Procedures (including critical care time) Labs Review Labs Reviewed  COMPREHENSIVE METABOLIC PANEL - Abnormal; Notable for the following:    Glucose, Bld 119 (*)    All other components within normal limits  URINALYSIS, ROUTINE W REFLEX [facility]CROSCOPIC (NOT [facility]) - Abnormal; Notable for the following:    Color, Urine COLORLESS (*)    APPearance CLOUDY (*)    Hgb urine dipstick LARGE (*)    Protein, ur 30 (*)    Leukocytes, UA SMALL (*)    All other components within normal limits  URINE MICROSCOPIC-ADD ON - Abnormal; Notable for the following:    Squamous Epithelial / LPF 0-5 (*)    Bacteria, UA RARE (*)    All other components within normal limits  LIPASE, BLOOD  CBC    Imaging Review Ct Renal Stone Study  03/07/2015  CLINICAL DATA:  Right flank pain. EXAM: CT ABDOMEN AND PELVIS WITHOUT  CONTRAST TECHNIQUE: Multidetector CT imaging of the abdomen and pelvis was performed following the standard protocol without IV contrast. COMPARISON:  10/22/2013 CT abdomen/pelvis. FINDINGS: Lower chest: No significant pulmonary nodules or acute consolidative airspace disease. Hepatobiliary: Normal liver with no liver mass. Normal gallbladder with no radiopaque cholelithiasis. No biliary ductal dilatation. Pancreas: Normal, with no mass or duct dilation. Spleen: Normal size. No mass. Adrenals/Urinary Tract: Normal  adrenals. There is a 2 mm stone in the upper pelvic segment of the right ureter, with minimal right hydroureteronephrosis and minimal right perinephric fat stranding. No additional stones are seen in the kidneys or ureters. No left hydronephrosis. No contour deforming renal masses. Normal bladder. Stomach/Bowel: Grossly normal stomach. Normal caliber small bowel with no small bowel wall thickening. Normal appendix. Normal large bowel with no diverticulosis, large bowel wall thickening or pericolonic fat stranding. Vascular/Lymphatic: Normal caliber abdominal aorta. No pathologically enlarged lymph nodes in the abdomen or pelvis. Reproductive: Normal prostate. Other: No pneumoperitoneum, ascites or focal fluid collection. Musculoskeletal: No aggressive appearing focal osseous lesions. IMPRESSION: Obstructing 2 mm stone in the upper pelvic right ureter, with minimal right hydroureteronephrosis and minimal right perinephric fat stranding. Electronically Signed   By: Delbert Phenix M.D.   On: 03/07/2015 11:35   I have personally reviewed and evaluated these images and lab results as part of my medical decision-making.   EKG Interpretation None      MDM   Final diagnoses:  Right flank pain  Right kidney stone    CT scan reveals a 2 mm proximal ureteral stone c minimal hydronephrosis.  Pain is well-controlled. Discussed findings with patient and his mother. Discharge medications Toradol 10 mg and Flomax 0.4 mg.    Donnetta Hutching, MD 03/07/15 1310

## 2015-03-07 NOTE — ED Notes (Signed)
MD at bedside. 

## 2015-03-07 NOTE — Discharge Instructions (Signed)
You have a 2 mm kidney stone. This will most likely past. Medications for pain and to increase urinary flow. Phone number for urologist given.

## 2016-08-03 IMAGING — CT CT RENAL STONE PROTOCOL
2 of 3 series · 16 of 37 positions shown, 18 images · non-contrast
Comparison: 10/22/2013 CT abdomen/pelvis.

CLINICAL DATA: Right flank pain.

EXAM:
CT ABDOMEN AND PELVIS WITHOUT CONTRAST
TECHNIQUE: Multidetector CT imaging of the abdomen and pelvis was performed
following the standard protocol without IV contrast.

[Series 4: lung · axial · 0.64mm/px · z∈[-202,-102]mm · 13 of 23 slices shown, 15 images]
[im 2/23  soft-tissue]
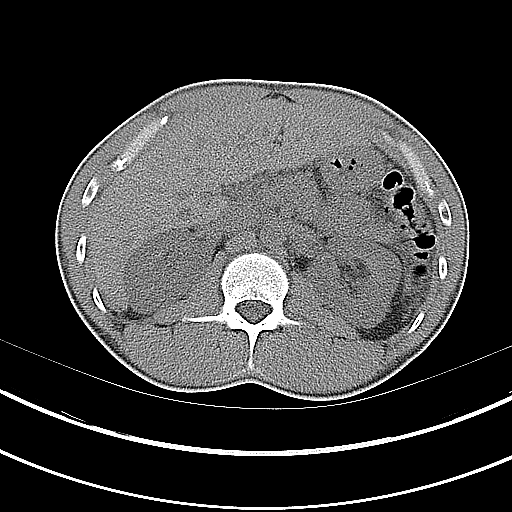
[im 2/23  bone]
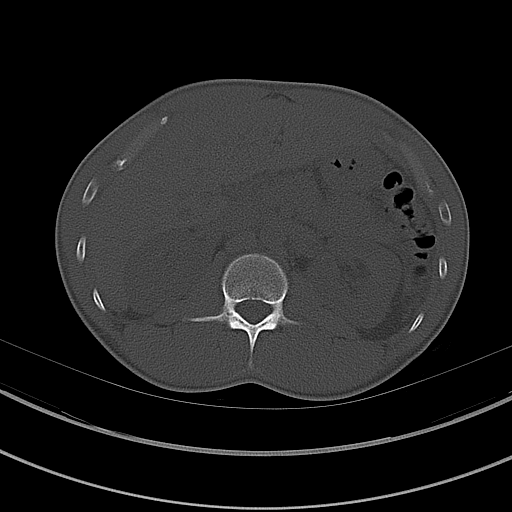
[im 4/23  soft-tissue]
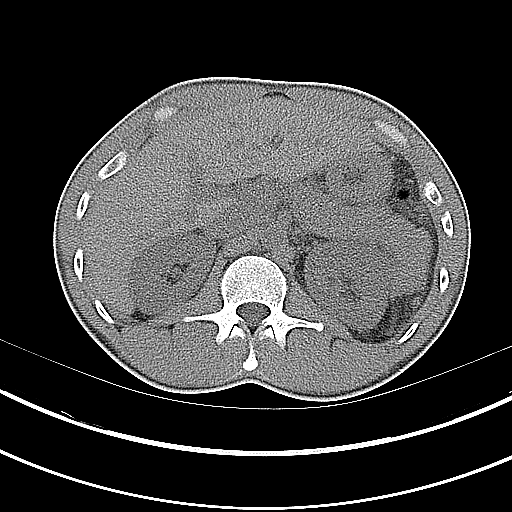
[im 6/23  soft-tissue]
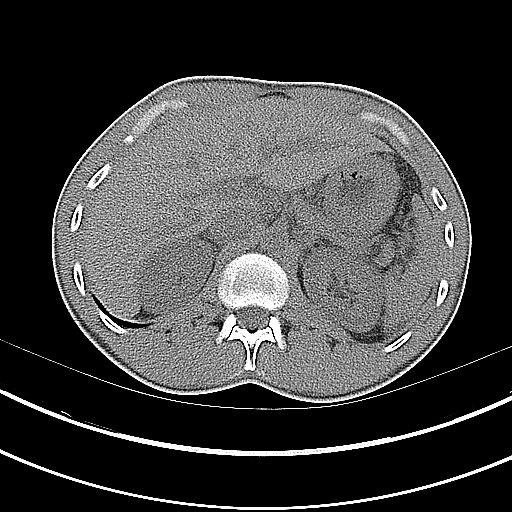
[im 7/23  soft-tissue]
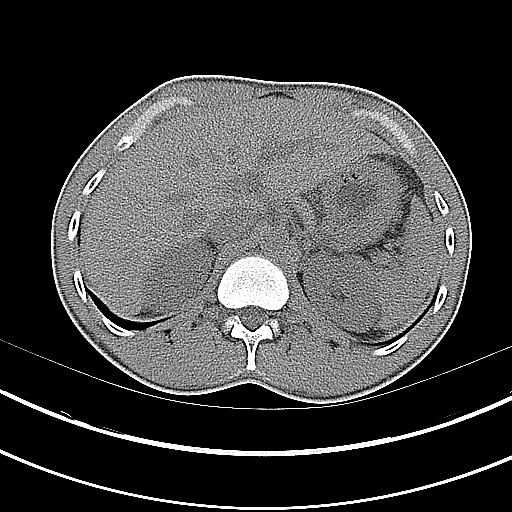
[im 9/23  soft-tissue]
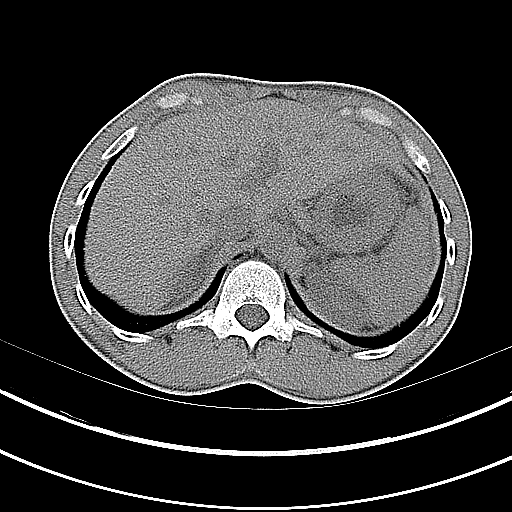
[im 10/23  soft-tissue]
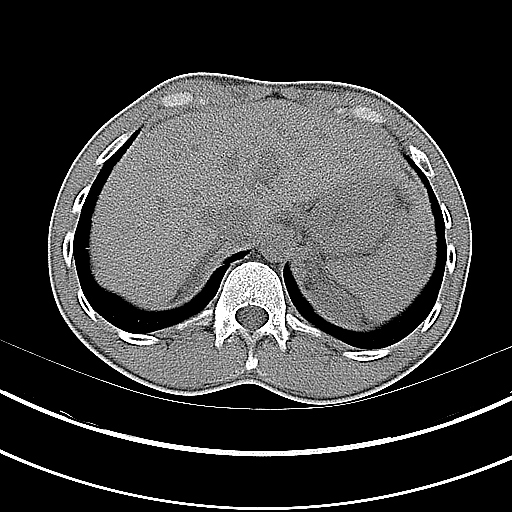
[im 12/23  soft-tissue]
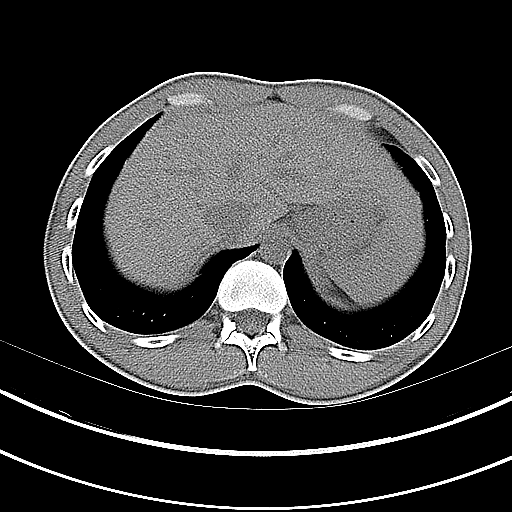
[im 14/23  soft-tissue]
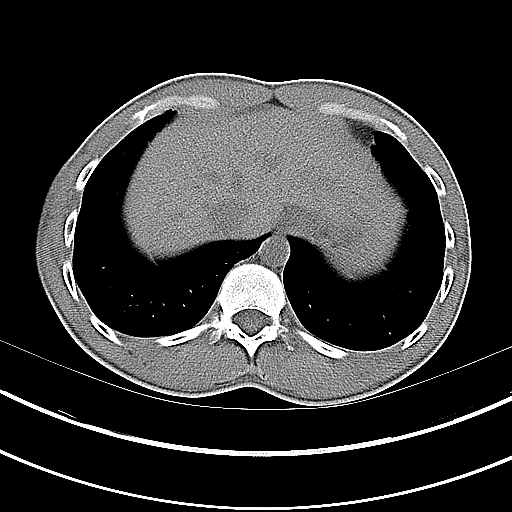
[im 15/23  soft-tissue]
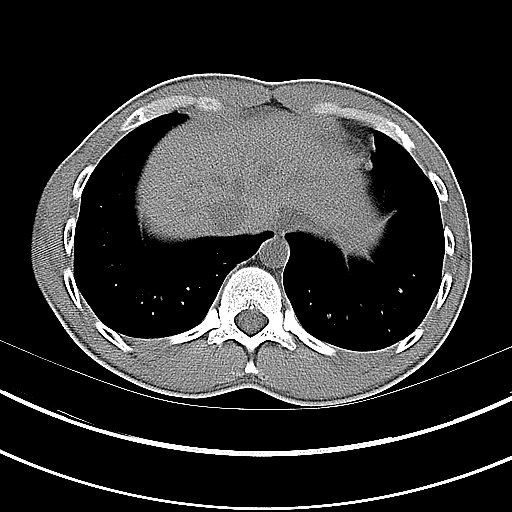
[im 15/23  bone]
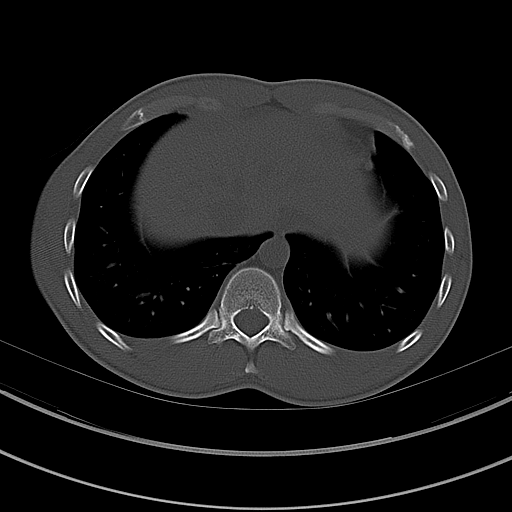
[im 17/23  soft-tissue]
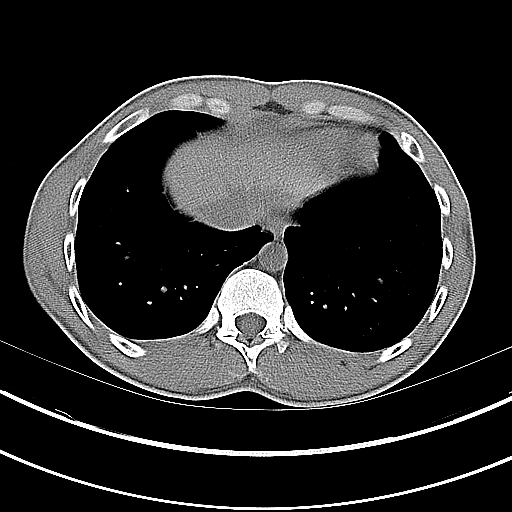
[im 18/23  soft-tissue]
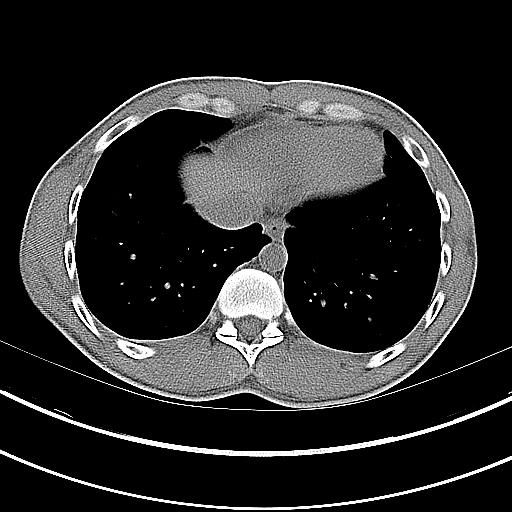
[im 20/23  soft-tissue]
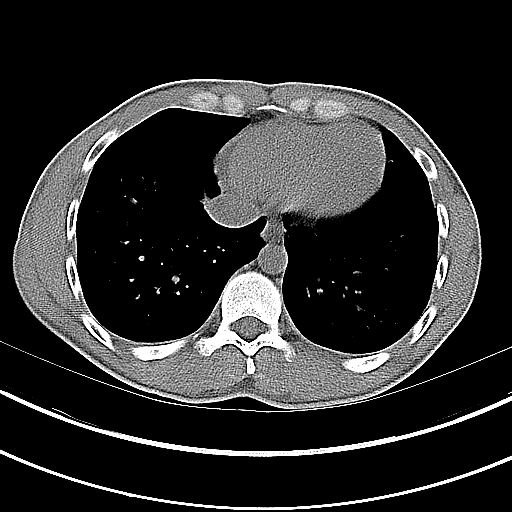
[im 22/23  soft-tissue]
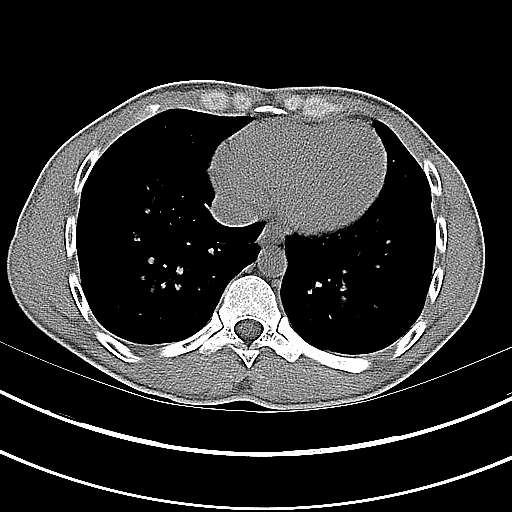

[Series 5: coronal · coronal · 0.64mm/px · 3 of 76 slices shown]
[im 26/76  soft-tissue]
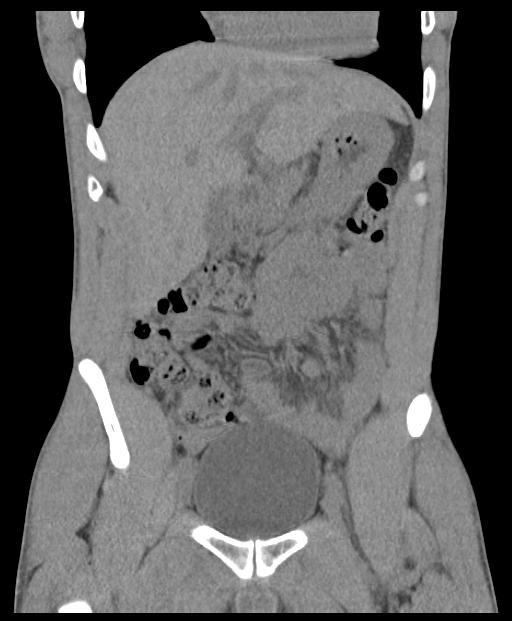
[im 34/76  soft-tissue]
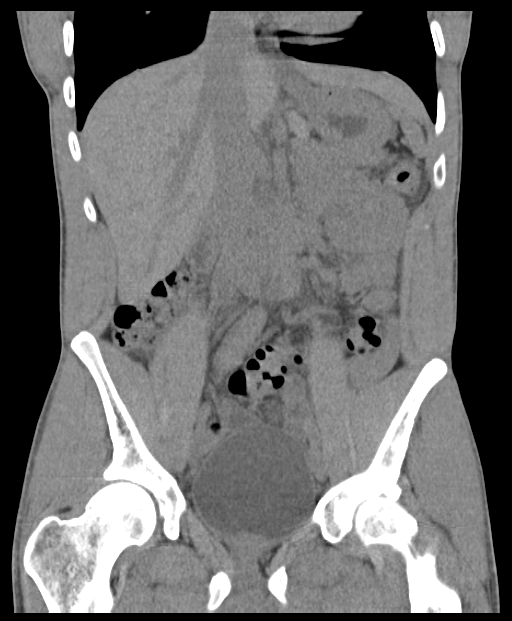
[im 42/76  soft-tissue]
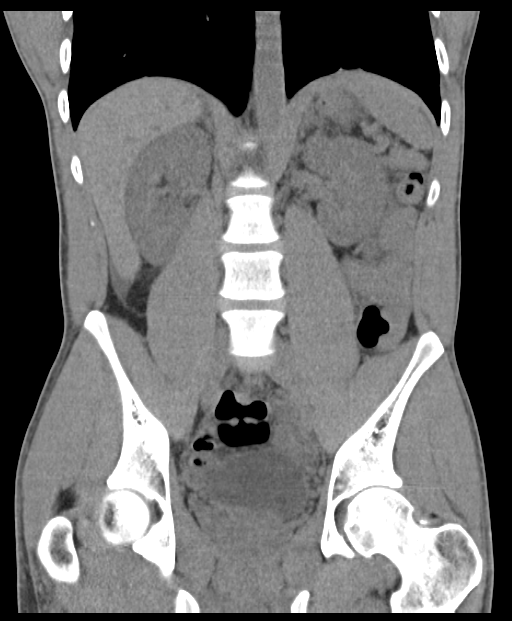

[16 of 37 positions shown; findings below may reference images not displayed]

FINDINGS: Lower chest: No significant pulmonary nodules or acute consolidative
airspace disease.

Hepatobiliary: Normal liver with no liver mass. Normal gallbladder
with no radiopaque cholelithiasis. No biliary ductal dilatation.

Pancreas: Normal, with no mass or duct dilation.

Spleen: Normal size. No mass.

Adrenals/Urinary Tract: Normal adrenals. There is a 2 mm stone in
the upper pelvic segment of the right ureter, with minimal right
hydroureteronephrosis and minimal right perinephric fat stranding.
No additional stones are seen in the kidneys or ureters. No left
hydronephrosis. No contour deforming renal masses. Normal bladder.

Stomach/Bowel: Grossly normal stomach. Normal caliber small bowel
with no small bowel wall thickening. Normal appendix. Normal large
bowel with no diverticulosis, large bowel wall thickening or
pericolonic fat stranding.

Vascular/Lymphatic: Normal caliber abdominal aorta. No
pathologically enlarged lymph nodes in the abdomen or pelvis.

Reproductive: Normal prostate.

Other: No pneumoperitoneum, ascites or focal fluid collection.

Musculoskeletal: No aggressive appearing focal osseous lesions.
IMPRESSION: Obstructing 2 mm stone in the upper pelvic right ureter, with
minimal right hydroureteronephrosis and minimal right perinephric
fat stranding.

## 2022-02-08 ENCOUNTER — Other Ambulatory Visit (HOSPITAL_COMMUNITY): Payer: Self-pay

## 2023-01-18 ENCOUNTER — Telehealth: Payer: Self-pay

## 2023-01-18 ENCOUNTER — Ambulatory Visit
Admission: EM | Admit: 2023-01-18 | Discharge: 2023-01-18 | Disposition: A | Discharge status: Home / Self Care | Payer: 59 | Attending: Physician Assistant | Admitting: Physician Assistant

## 2023-01-18 ENCOUNTER — Ambulatory Visit: Payer: 59

## 2023-01-18 DIAGNOSIS — M5412 Radiculopathy, cervical region: Secondary | ICD-10-CM

## 2023-01-18 DIAGNOSIS — S92424A Nondisplaced fracture of distal phalanx of right great toe, initial encounter for closed fracture: Secondary | ICD-10-CM

## 2023-01-18 DIAGNOSIS — M79674 Pain in right toe(s): Secondary | ICD-10-CM | POA: Diagnosis not present

## 2023-01-18 DIAGNOSIS — M79671 Pain in right foot: Secondary | ICD-10-CM

## 2023-01-18 MED ORDER — CYCLOBENZAPRINE HCL 10 MG PO TABS: 10.0000 mg | ORAL_TABLET | Freq: Two times a day (BID) | ORAL | 0 refills | Status: DC | PRN

## 2023-01-18 MED ORDER — PREDNISONE 20 MG PO TABS: 40.0000 mg | ORAL_TABLET | Freq: Every day | ORAL | 0 refills | Status: AC

## 2023-01-18 NOTE — Telephone Encounter (Signed)
-----   Message from Tomi Bamberger sent at 01/18/2023  2:59 PM EDT ----- Please notify patient of fracture to distal toe-- he can buddy tape toe if he would like, fracture is nondisplaced and should heal without further management. If he would like post op shoe please offer same. ----- Message ----- From: Interface, Rad Results In Sent: 01/18/2023  12:47 PM EDT To: Tomi Bamberger, PA-C

## 2023-01-18 NOTE — ED Provider Notes (Signed)
[name]    CSN: [id number] Arrival date & time: 01/18/23  1024      History   Chief Complaint Chief Complaint  Patient presents with   Neck Pain   Toe Pain    HPI Joe Lewis is a 37 y.o. male.   Patient here today for evaluation of neck pain.  He notes with certain movements he will have pain that radiates into his left shoulder and left arm and down to his fingers.  He notes this is a burning pain.  He denies any numbness.  He has not had any weakness.  He denies any injury but does report a lot of lifting at work.  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  He reports she has had pain with ambulation since that time and has bruising to his right great toe.  The history is provided by the patient.    Past Medical History:  Diagnosis Date   GERD (gastroesophageal reflux disease)    Stomach ulcer     Patient Active Problem List   Diagnosis Date Noted   Left flank pain 03/07/2015    History reviewed. No pertinent surgical history.     Home Medications    Prior to Admission medications   Medication Sig Start Date End Date Taking? Authorizing Provider  cyclobenzaprine (FLEXERIL) 10 MG tablet Take 1 tablet (10 mg total) by mouth 2 (two) times daily as needed for muscle spasms. 01/18/23  Yes Tomi Bamberger, PA-C  predniSONE (DELTASONE) 20 MG tablet Take 2 tablets (40 mg total) by mouth daily with breakfast for 5 days. 01/18/23 01/23/23 Yes Tomi Bamberger, PA-C  acetaminophen (TYLENOL) 325 MG tablet Take 325 mg by mouth every 6 (six) hours as needed for mild pain.    [provider]  diphenhydrAMINE (BENADRYL) 25 mg capsule Take 25 mg by mouth at bedtime as needed for sleep.    [provider]  ketorolac (TORADOL) 10 MG tablet Take 1 tablet (10 mg total) by mouth every 6 (six) hours as needed. 03/07/15   Donnetta Hutching, MD   tamsulosin (FLOMAX) 0.4 MG CAPS capsule Take 1 capsule (0.4 mg total) by mouth daily. 03/07/15   Donnetta Hutching, MD    Family History History reviewed. No pertinent family history.  Social History Social History   Tobacco Use   Smoking status: Every Day    Types: Cigarettes  Vaping Use   Vaping status: Never Used  Substance Use Topics   Alcohol use: Yes    Comment: Occassionally.   Drug use: Yes    Frequency: 7.0 times per week    Types: Marijuana     Allergies   Patient has no known allergies.   Review of Systems Review of Systems  Constitutional:  Negative for chills and fever.  Eyes:  Negative for discharge and redness.  Respiratory:  Negative for shortness of breath.   Musculoskeletal:  Positive for back pain, myalgias and neck pain.  Neurological:  Negative for numbness.     Physical Exam Triage Vital Signs ED Triage Vitals  Encounter Vitals Group     BP      Systolic BP Percentile      Diastolic BP Percentile      Pulse      Resp      Temp      Temp src  SpO2      Weight      Height      Head Circumference      Peak Flow      Pain Score      Pain Loc      Pain Education      Exclude from Growth Chart    No data found.  Updated Vital Signs BP 115/67 (BP Location: Left Arm)   Pulse 80   Temp 98.3 F (36.8 C) (Oral)   Resp 16   Ht 5\' 7"  (1.702 m)   Wt 117 lb 1 oz (53.1 kg)   SpO2 98%   BMI 18.33 kg/m      Physical Exam Vitals and nursing note reviewed.  Constitutional:      General: He is not in acute distress.    Appearance: Normal appearance. He is not ill-appearing.  HENT:     Head: Normocephalic and atraumatic.  Eyes:     Conjunctiva/sclera: Conjunctivae normal.  Neck:     Comments: Normal range of motion of the neck with radiating pain to left arm with neck extension and rotation to the left Cardiovascular:     Rate and Rhythm: Normal rate.  Pulmonary:     Effort: Pulmonary effort is normal. No respiratory distress.   Musculoskeletal:     Cervical back: Normal range of motion. No tenderness.     Comments: Decreased range of motion of right great toe with bruising noted to mid toe.  Skin:    Capillary Refill: Normal cap refill to right toe Neurological:     Mental Status: He is alert.     Comments: Gross sensation intact to distal right toe  Psychiatric:        Mood and Affect: Mood normal.        Behavior: Behavior normal.        Thought Content: Thought content normal.      UC Treatments / Results  Labs (all labs ordered are listed, but only abnormal results are displayed) Labs Reviewed - No data to display  EKG   Radiology DG Foot Complete Right  Result Date: 01/18/2023 CLINICAL DATA:  Tripped yesterday while running, great toe pain EXAM: RIGHT FOOT COMPLETE - 3+ VIEW COMPARISON:  None Available. FINDINGS: There is an acute nondisplaced fracture along the dorsal aspect of the base of the great toe distal phalanx with intra-articular extension best seen on the lateral projection with surrounding soft tissue swelling. There is no other acute fracture. There is no dislocation. Bony alignment is normal. The joint spaces are preserved. There is no erosive change. IMPRESSION: Acute nondisplaced intra-articular fracture of the base of the great toe distal phalanx. Electronically Signed   By: Lesia Hausen M.D.   On: 01/18/2023 12:44    Procedures Procedures (including critical care time)  Medications Ordered in UC Medications - No data to display  Initial Impression / Assessment and Plan / UC Course  I have reviewed the triage vital signs and the nursing notes.  Pertinent labs & imaging results that were available during my care of the patient were reviewed by me and considered in my medical decision making (see chart for details).    Suspect cervical radiculopathy and will treat with steroid burst and muscle relaxer.  Fracture noted on x-ray to distal phalanx.  Postop shoe offered.  Advise  follow-up if no gradual improvement of toe pain or neck/arm pain.  Patient expresses understanding.  Final Clinical Impressions(s) / UC Diagnoses  Final diagnoses:  Cervical radiculopathy  Right foot pain  Closed nondisplaced fracture of distal phalanx of right great toe, initial encounter   Discharge Instructions   None    ED Prescriptions     Medication Sig Dispense Auth. Provider   predniSONE (DELTASONE) 20 MG tablet Take 2 tablets (40 mg total) by mouth daily with breakfast for 5 days. 10 tablet Erma Pinto F, PA-C   cyclobenzaprine (FLEXERIL) 10 MG tablet Take 1 tablet (10 mg total) by mouth 2 (two) times daily as needed for muscle spasms. 20 tablet Tomi Bamberger, PA-C      PDMP not reviewed this encounter.   Tomi Bamberger, PA-C 01/18/23 1558

## 2023-01-18 NOTE — Telephone Encounter (Signed)
Attempted to notify patient of the following:  Tomi Bamberger, PA-C  Bryn Gulling Maurice March, CMA Please notify patient of fracture to distal toe-- he can buddy tape toe if he would like, fracture is nondisplaced and should heal without further management. If he would like post op shoe please offer same.  No answer. Will retry. B. Roten CMA

## 2023-01-18 NOTE — ED Triage Notes (Signed)
Neck Pain extending to fingers; Toe Pain; work note - Entered by patient.  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  "I was running yesterday, I tripped an slid, after the big toe felt funny with pain during the night, Right great toe".

## 2023-01-21 NOTE — Telephone Encounter (Signed)
Call received from Mr. Gassmann stating he had received a message from [facility] to call us. Advised him of recommendations per Erma Pinto, PA-C (see below). He states that he has been buddy taping his toe but may like a post-op shoe as well. Explained he can come back to get post-op shoe if would like and advised that Urgent Care closes at 4pm today.   ----- Message from Tomi Bamberger sent at 01/18/2023  2:59 PM EDT ----- Please notify patient of fracture to distal toe-- he can buddy tape toe if he would like, fracture is nondisplaced and should heal without further management. If he would like post op shoe please offer same. ----- Message ----- From: Interface, Rad Results In Sent: 01/18/2023  12:47 PM EDT To: Tomi Bamberger, PA-C

## 2023-01-26 ENCOUNTER — Telehealth: Payer: Self-pay

## 2023-01-26 NOTE — Telephone Encounter (Signed)
LM notifying patient to check MyChart for information.  Message from Tomi Bamberger sent at 01/18/2023  2:59 PM EDT ----- Please notify patient of fracture to distal toe-- he can buddy tape toe if he would like, fracture is nondisplaced and should heal without further management. If he would like post op shoe please offer same.  B. Roten CMA

## 2023-01-26 NOTE — Telephone Encounter (Signed)
-----   Message from Tomi Bamberger sent at 01/18/2023  2:59 PM EDT ----- Please notify patient of fracture to distal toe-- he can buddy tape toe if he would like, fracture is nondisplaced and should heal without further management. If he would like post op shoe please offer same. ----- Message ----- From: Interface, Rad Results In Sent: 01/18/2023  12:47 PM EDT To: Tomi Bamberger, PA-C

## 2023-01-31 ENCOUNTER — Other Ambulatory Visit: Payer: Self-pay

## 2023-01-31 ENCOUNTER — Encounter: Payer: Self-pay | Admitting: *Deleted

## 2023-01-31 ENCOUNTER — Ambulatory Visit
Admission: EM | Admit: 2023-01-31 | Discharge: 2023-01-31 | Disposition: A | Discharge status: Home / Self Care | Payer: 59 | Attending: Physician Assistant | Admitting: Physician Assistant

## 2023-01-31 DIAGNOSIS — S92414A Nondisplaced fracture of proximal phalanx of right great toe, initial encounter for closed fracture: Secondary | ICD-10-CM | POA: Diagnosis not present

## 2023-01-31 DIAGNOSIS — M5412 Radiculopathy, cervical region: Secondary | ICD-10-CM | POA: Diagnosis not present

## 2023-01-31 MED ORDER — NAPROXEN 500 MG PO TABS: 500.0000 mg | ORAL_TABLET | Freq: Two times a day (BID) | ORAL | 0 refills | Status: AC

## 2023-01-31 NOTE — ED Provider Notes (Signed)
[name]    CSN: [id number] Arrival date & time: 01/31/23  1237      History   Chief Complaint Chief Complaint  Patient presents with   Shoulder Pain    HPI Joe Lewis is a 37 y.o. male.   Patient here today for evaluation of neck pain that extends to his fingers.  He states that when he looks up he has pain to his left arm and fingers.  He states he works in [employer] and does a lot of lifting but has not had any known injury.  He also reports some pain to his right great toe after he accidentally tripped and slid while running yesterday.  He denies any true numbness but has had tingling in his left fingers.  He has been taking medication as prescribed (steroid and Flexeril) without resolution.  The history is provided by the patient.  Shoulder Pain Associated symptoms: neck pain   Associated symptoms: no fever     Past Medical History:  Diagnosis Date   GERD (gastroesophageal reflux disease)    Stomach ulcer     Patient Active Problem List   Diagnosis Date Noted   Tobacco use 02/02/2023    History reviewed. No pertinent surgical history.     Home Medications    Prior to Admission medications   Medication Sig Start Date End Date Taking? Authorizing Provider  acetaminophen (TYLENOL) 325 MG tablet Take 325 mg by mouth every 6 (six) hours as needed for mild pain.   Yes [provider]  cyclobenzaprine (FLEXERIL) 10 MG tablet Take 1 tablet (10 mg total) by mouth 2 (two) times daily as needed for muscle spasms. 01/18/23  Yes Tomi Bamberger, PA-C  naproxen (NAPROSYN) 500 MG tablet Take 1 tablet (500 mg total) by mouth 2 (two) times daily. 01/31/23  Yes Tomi Bamberger, PA-C    Family History History reviewed. No pertinent family history.  Social History Social History   Tobacco Use   Smoking status: Every Day    Current packs/day: 0.50    Average packs/day: 0.5 packs/day for 17.8 years (8.9 ttl pk-yrs)    Types:  Cigarettes    Start date: 2007  Vaping Use   Vaping status: Never Used  Substance Use Topics   Alcohol use: Yes    Comment: Occassionally.   Drug use: Yes    Frequency: 7.0 times per week    Types: Marijuana     Allergies   Patient has no known allergies.   Review of Systems Review of Systems  Constitutional:  Negative for chills and fever.  Eyes:  Negative for discharge and redness.  Respiratory:  Negative for shortness of breath.   Gastrointestinal:  Negative for nausea and vomiting.  Musculoskeletal:  Positive for arthralgias and neck pain.  Neurological:  Negative for numbness.     Physical Exam Triage Vital Signs ED Triage Vitals  Encounter Vitals Group     BP 01/31/23 1421 137/84     Systolic BP Percentile --      Diastolic BP Percentile --      Pulse Rate 01/31/23 1421 75     Resp 01/31/23 1421 18     Temp 01/31/23 1421 97.8 F (36.6 C)     Temp Source 01/31/23 1421 Oral     SpO2 01/31/23 1421 98 %     Weight --      Height --      Head Circumference --  Peak Flow --      Pain Score 01/31/23 1419 7     Pain Loc --      Pain Education --      Exclude from Growth Chart --    No data found.  Updated Vital Signs BP 137/84 (BP Location: Right Arm)   Pulse 75   Temp 97.8 F (36.6 C) (Oral)   Resp 18   SpO2 98%   Physical Exam Vitals and nursing note reviewed.  Constitutional:      General: He is not in acute distress.    Appearance: Normal appearance. He is not ill-appearing.  HENT:     Head: Normocephalic and atraumatic.  Eyes:     Conjunctiva/sclera: Conjunctivae normal.  Cardiovascular:     Rate and Rhythm: Normal rate.  Pulmonary:     Effort: Pulmonary effort is normal. No respiratory distress.  Musculoskeletal:     Comments: Range of motion of neck with radicular pain to the left reproduced with full neck extension. , Mild swelling and pain to right great toe.   Neurological:     Mental Status: He is alert.  Psychiatric:         Mood and Affect: Mood normal.        Behavior: Behavior normal.        Thought Content: Thought content normal.      UC Treatments / Results  Labs (all labs ordered are listed, but only abnormal results are displayed) Labs Reviewed - No data to display  EKG   Radiology No results found.  Procedures Procedures (including critical care time)  Medications Ordered in UC Medications - No data to display  Initial Impression / Assessment and Plan / UC Course  I have reviewed the triage vital signs and the nursing notes.  Pertinent labs & imaging results that were available during my care of the patient were reviewed by me and considered in my medical decision making (see chart for details).   Naproxen prescribed for suspected cervical radiculopathy as we are unable to prescribe further steroids and recommended follow-up with primary care doctor. Toe fracture noted,  recommended follow-up with Ortho as soon as possible.   Final Clinical Impressions(s) / UC Diagnoses   Final diagnoses:  Cervical radiculopathy   Discharge Instructions   None    ED Prescriptions     Medication Sig Dispense Auth. Provider   naproxen (NAPROSYN) 500 MG tablet Take 1 tablet (500 mg total) by mouth 2 (two) times daily. 30 tablet Tomi Bamberger, PA-C      PDMP not reviewed this encounter.   Tomi Bamberger, PA-C 02/14/23 2035

## 2023-01-31 NOTE — ED Triage Notes (Signed)
Pt reports he was seen here earlier this month:  Related encounter: ED from 01/18/2023 in [facility] at [facility])   Signed      Neck Pain extending to fingers; Toe Pain; work note - Entered by patient.   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   "I was running yesterday, I tripped an slid, after the big toe felt funny with pain during the night, Right great toe".          Reports he has been taking the prescribed medications (prednisone and flexeril) but now is having tingling in all five fingers of left hand

## 2023-02-02 ENCOUNTER — Ambulatory Visit: Payer: 59 | Admitting: Physician Assistant

## 2023-02-02 ENCOUNTER — Encounter: Payer: Self-pay | Admitting: Physician Assistant

## 2023-02-02 VITALS — BP 120/79 | HR 81 | Temp 98.1°F | Ht 67.0 in | Wt 130.1 lb

## 2023-02-02 DIAGNOSIS — Z720 Tobacco use: Secondary | ICD-10-CM

## 2023-02-02 DIAGNOSIS — M5412 Radiculopathy, cervical region: Secondary | ICD-10-CM | POA: Diagnosis not present

## 2023-02-02 NOTE — Assessment & Plan Note (Signed)
Pt was counseled, recommending smoking cessation

## 2023-02-02 NOTE — Progress Notes (Signed)
New patient visit   Patient: Joe Lewis   DOB: 1985-06-12   37 y.o. Male  MRN: [id number] Visit Date: 02/02/2023  Today's healthcare provider: Alfredia Ferguson, PA-C   Cc. Left neck/arm pain  Subjective    Joe Lewis is a 37 y.o. male who presents today as a new patient to establish care.   Pt reports continued left shoulder pain that radiates down his left arm and shoulder  for the last 2-3 weeks. 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   Reports the muscle relaxant did not make a difference, but the naproxen he has taken over the last 2 days has.   Past Medical History:  Diagnosis Date   GERD (gastroesophageal reflux disease)    Stomach ulcer    History reviewed. No pertinent surgical history. Family Status  Relation Name Status   Mother  Alive   Father  Alive  No partnership data on file   History reviewed. No pertinent family history. Social History   Socioeconomic History   Marital status: Single    Spouse name: Not on file   Number of children: Not on file   Years of education: Not on file   Highest education level: Not on file  Occupational History   Not on file  Tobacco Use   Smoking status: Every Day    Current packs/day: 0.50    Average packs/day: 0.5 packs/day for 17.8 years (8.9 ttl pk-yrs)    Types: Cigarettes    Start date: 2007   Smokeless tobacco: Not on file  Vaping Use   Vaping status: Never Used  Substance and Sexual Activity   Alcohol use: Yes    Comment: Occassionally.   Drug use: Yes    Frequency: 7.0 times per week    Types: Marijuana   Sexual activity: Yes    Birth control/protection: None  Other Topics Concern   Not on file  Social History Narrative   Not on file   Social Determinants of Health   Financial Resource Strain: Not on file  Food Insecurity: Not on file  Transportation  Needs: Not on file  Physical Activity: Not on file  Stress: Not on file  Social Connections: Not on file   Outpatient Medications Prior to Visit  Medication Sig   acetaminophen (TYLENOL) 325 MG tablet Take 325 mg by mouth every 6 (six) hours as needed for mild pain.   cyclobenzaprine (FLEXERIL) 10 MG tablet Take 1 tablet (10 mg total) by mouth 2 (two) times daily as needed for muscle spasms.   naproxen (NAPROSYN) 500 MG tablet Take 1 tablet (500 mg total) by mouth 2 (two) times daily.   No facility-administered medications prior to visit.   No Known Allergies  Immunization History  Administered Date(s) Administered   Hepatitis B, PED/ADOLESCENT 01/27/1998, 03/05/1998, 07/21/1998   Pfizer(Comirnaty)Fall Seasonal Vaccine 12 years and older 11/18/2019, 12/09/2019    Health Maintenance  Topic Date Due   HIV Screening  Never done   Hepatitis C Screening  Never done   DTaP/Tdap/Td (1 - Tdap) Never done   COVID-19 Vaccine (2 - 2023-24 season) 12/17/2022   INFLUENZA VACCINE  07/16/2023 (Originally 11/16/2022)   HPV VACCINES  Aged Out    Patient Care Team: Alfredia Ferguson, PA-C as PCP - General (Physician Assistant)  Review of Systems  Constitutional:  Negative  for fatigue and fever.  Respiratory:  Negative for cough and shortness of breath.   Cardiovascular:  Negative for chest pain, palpitations and leg swelling.  Musculoskeletal:  Positive for neck pain.  Neurological:  Negative for dizziness and headaches.        Objective    BP 120/79   Pulse 81   Temp 98.1 F (36.7 C)   Ht 5\' 7"  (1.702 m)   Wt 130 lb 2 oz (59 kg)   SpO2 100%   BMI 20.38 kg/m     Physical Exam Constitutional:      General: He is awake.     Appearance: He is well-developed.  HENT:     Head: Normocephalic.  Eyes:     Conjunctiva/sclera: Conjunctivae normal.  Cardiovascular:     Rate and Rhythm: Normal rate and regular rhythm.     Heart sounds: Normal heart sounds.  Pulmonary:     Effort:  Pulmonary effort is normal.     Breath sounds: Normal breath sounds.  Musculoskeletal:     Comments: Tight L trap muscle.  No radicular symptoms w/ rom of neck  Skin:    General: Skin is warm.  Neurological:     Mental Status: He is alert and oriented to person, place, and time.  Psychiatric:        Attention and Perception: Attention normal.        Mood and Affect: Mood normal.        Speech: Speech normal.        Behavior: Behavior is cooperative.    Depression Screen    02/02/2023    3:20 PM  PHQ 2/9 Scores  PHQ - 2 Score 0   No results found for any visits on 02/02/23.  Assessment & Plan     Cervical radiculopathy Cont naproxen bid for inflammatory and pain relief  Advised heat/ice, gentle exercises/stretching If pain persists would order imaging and refer to PT  Pt is agreeable w/ plan  Tobacco use Assessment & Plan: Pt was counseled, recommending smoking cessation  Pt declines vaccines today. Recommending CPE in the next 6 months.   Return in about 6 months (around 08/03/2023), or if symptoms worsen or fail to improve, for CPE.      Alfredia Ferguson, PA-C  [facility] at [facility] [phone] (phone) [phone] (fax)  [facility]

## 2023-02-12 ENCOUNTER — Encounter: Payer: Self-pay | Admitting: Physician Assistant

## 2023-02-13 ENCOUNTER — Other Ambulatory Visit: Payer: Self-pay | Admitting: Physician Assistant

## 2023-02-13 DIAGNOSIS — M5412 Radiculopathy, cervical region: Secondary | ICD-10-CM

## 2023-02-27 ENCOUNTER — Encounter: Payer: Self-pay | Admitting: Sports Medicine

## 2023-02-27 ENCOUNTER — Ambulatory Visit (INDEPENDENT_AMBULATORY_CARE_PROVIDER_SITE_OTHER): Payer: 59 | Admitting: Sports Medicine

## 2023-02-27 VITALS — BP 110/70 | Ht 67.0 in | Wt 130.0 lb

## 2023-02-27 DIAGNOSIS — M5412 Radiculopathy, cervical region: Secondary | ICD-10-CM | POA: Diagnosis not present

## 2023-02-27 MED ORDER — CYCLOBENZAPRINE HCL 10 MG PO TABS: 10.0000 mg | ORAL_TABLET | Freq: Two times a day (BID) | ORAL | 0 refills | Status: AC | PRN

## 2023-02-27 NOTE — Progress Notes (Signed)
   Subjective:    Patient ID: Joe Lewis, male    DOB: [birth year], 37 y.o.   MRN: [id number]  HPI chief complaint: Left arm pain  Patient is a very pleasant left-hand-dominant 37 year old male that presents today with a little over a month of left shoulder and arm pain.  He was initially seen at an urgent care and prescribed steroids but they were not very effective.  He recently saw his primary care provider who prescribed Naprosyn and Flexeril.  These have been somewhat helpful.  He notices pain primarily with neck extension and rotation to the left.  He made a change to his bedding and that has helped.  He experiences intermittent spasm in the left arm as well as numbness in the left thumb.  He has not noticed any weakness.  He denies similar issues in the past.  No pain in the right arm.  Past medical history reviewed Medications reviewed Allergies reviewed  Review of Systems As above    Objective:   Physical Exam  Well-developed, well-nourished.  No acute distress  Cervical spine: Good cervical range of motion.  Positive Spurling's to the left.  Neurological exam: Strength is 5/5 in both upper extremities.  Reflexes are 1/4 and equal at the biceps, triceps, and brachial radialis tendons bilaterally.      Assessment & Plan:   Left arm cervical radiculopathy  Patient may continue with his naproxen and Flexeril as needed.  We will refer him to physical therapy where he may wean to a home exercise program per the therapist discretion.  If symptoms do not continue to improve then consider MRI of the cervical spine.  Follow-up for ongoing or recalcitrant issues.  This note was dictated using Dragon naturally speaking software and may contain errors in syntax, spelling, or content which have not been identified prior to signing this note.
